# Patient Record
Sex: Male | Born: 1955 | Race: White | Hispanic: No | Marital: Single | Smoking: Never smoker
Health system: Southern US, Community
[De-identification: ages and names within clinical notes are randomized; demographics above are authoritative.]

## PROBLEM LIST (undated history)

## (undated) DIAGNOSIS — F4310 Post-traumatic stress disorder, unspecified: Secondary | ICD-10-CM

## (undated) DIAGNOSIS — F319 Bipolar disorder, unspecified: Secondary | ICD-10-CM

## (undated) DIAGNOSIS — F329 Major depressive disorder, single episode, unspecified: Secondary | ICD-10-CM

## (undated) DIAGNOSIS — F22 Delusional disorders: Secondary | ICD-10-CM

## (undated) DIAGNOSIS — F209 Schizophrenia, unspecified: Secondary | ICD-10-CM

## (undated) DIAGNOSIS — F32A Depression, unspecified: Secondary | ICD-10-CM

## (undated) DIAGNOSIS — F419 Anxiety disorder, unspecified: Secondary | ICD-10-CM

## (undated) HISTORY — PX: CHOLECYSTECTOMY: SHX55

## (undated) HISTORY — PX: SINUSOTOMY: SHX291

## (undated) HISTORY — PX: KNEE ARTHROPLASTY: SHX992

---

## 2000-06-12 ENCOUNTER — Inpatient Hospital Stay (HOSPITAL_COMMUNITY): Admission: AD | Admit: 2000-06-12 | Discharge: 2000-06-14 | Payer: Self-pay | Admitting: Addiction Psychiatry

## 2000-11-15 ENCOUNTER — Other Ambulatory Visit: Admission: RE | Admit: 2000-11-15 | Discharge: 2000-11-15 | Payer: Self-pay | Admitting: Otolaryngology

## 2003-08-03 ENCOUNTER — Ambulatory Visit (HOSPITAL_BASED_OUTPATIENT_CLINIC_OR_DEPARTMENT_OTHER): Admission: RE | Admit: 2003-08-03 | Discharge: 2003-08-04 | Payer: Self-pay | Admitting: Orthopaedic Surgery

## 2014-07-08 ENCOUNTER — Ambulatory Visit
Admission: RE | Admit: 2014-07-08 | Discharge: 2014-07-08 | Disposition: A | Discharge status: Home / Self Care | Payer: Medicare Other | Source: Ambulatory Visit | Attending: Family | Admitting: Family

## 2014-07-08 ENCOUNTER — Other Ambulatory Visit: Payer: Self-pay | Admitting: Family

## 2014-07-08 DIAGNOSIS — M79671 Pain in right foot: Secondary | ICD-10-CM

## 2014-12-21 ENCOUNTER — Other Ambulatory Visit: Payer: Self-pay | Admitting: Family

## 2014-12-21 ENCOUNTER — Ambulatory Visit
Admission: RE | Admit: 2014-12-21 | Discharge: 2014-12-21 | Disposition: A | Discharge status: Home / Self Care | Payer: Self-pay | Source: Ambulatory Visit | Attending: Family | Admitting: Family

## 2014-12-21 DIAGNOSIS — S8011XA Contusion of right lower leg, initial encounter: Secondary | ICD-10-CM

## 2015-06-25 ENCOUNTER — Encounter (HOSPITAL_COMMUNITY): Payer: Self-pay

## 2015-06-25 ENCOUNTER — Emergency Department (HOSPITAL_COMMUNITY): Payer: Medicare Other

## 2015-06-25 ENCOUNTER — Emergency Department (HOSPITAL_COMMUNITY)
Admission: EM | Admit: 2015-06-25 | Discharge: 2015-06-26 | Disposition: A | Discharge status: Home / Self Care | Payer: Medicare Other | Attending: Emergency Medicine | Admitting: Emergency Medicine

## 2015-06-25 DIAGNOSIS — F22 Delusional disorders: Secondary | ICD-10-CM | POA: Insufficient documentation

## 2015-06-25 DIAGNOSIS — F1210 Cannabis abuse, uncomplicated: Secondary | ICD-10-CM | POA: Insufficient documentation

## 2015-06-25 DIAGNOSIS — Z880 Allergy status to penicillin: Secondary | ICD-10-CM | POA: Diagnosis not present

## 2015-06-25 DIAGNOSIS — Z7982 Long term (current) use of aspirin: Secondary | ICD-10-CM | POA: Insufficient documentation

## 2015-06-25 DIAGNOSIS — F319 Bipolar disorder, unspecified: Secondary | ICD-10-CM | POA: Diagnosis not present

## 2015-06-25 DIAGNOSIS — Z9119 Patient's noncompliance with other medical treatment and regimen: Secondary | ICD-10-CM | POA: Diagnosis not present

## 2015-06-25 DIAGNOSIS — Z79899 Other long term (current) drug therapy: Secondary | ICD-10-CM | POA: Insufficient documentation

## 2015-06-25 DIAGNOSIS — F419 Anxiety disorder, unspecified: Secondary | ICD-10-CM | POA: Diagnosis not present

## 2015-06-25 DIAGNOSIS — F1310 Sedative, hypnotic or anxiolytic abuse, uncomplicated: Secondary | ICD-10-CM | POA: Insufficient documentation

## 2015-06-25 HISTORY — DX: Depression, unspecified: F32.A

## 2015-06-25 HISTORY — DX: Anxiety disorder, unspecified: F41.9

## 2015-06-25 HISTORY — DX: Major depressive disorder, single episode, unspecified: F32.9

## 2015-06-25 HISTORY — DX: Schizophrenia, unspecified: F20.9

## 2015-06-25 HISTORY — DX: Bipolar disorder, unspecified: F31.9

## 2015-06-25 HISTORY — DX: Delusional disorders: F22

## 2015-06-25 HISTORY — DX: Post-traumatic stress disorder, unspecified: F43.10

## 2015-06-25 LAB — BASIC METABOLIC PANEL
ANION GAP: 8 (ref 5–15)
BUN: 13 mg/dL (ref 6–20)
CALCIUM: 9.2 mg/dL (ref 8.9–10.3)
CHLORIDE: 106 mmol/L (ref 101–111)
CO2: 25 mmol/L (ref 22–32)
Creatinine, Ser: 1 mg/dL (ref 0.61–1.24)
GLUCOSE: 93 mg/dL (ref 65–99)
POTASSIUM: 4.2 mmol/L (ref 3.5–5.1)
SODIUM: 139 mmol/L (ref 135–145)

## 2015-06-25 LAB — ETHANOL

## 2015-06-25 LAB — ACETAMINOPHEN LEVEL: Acetaminophen (Tylenol), Serum: <10 ug/mL — ABNORMAL LOW (ref 10–30)

## 2015-06-25 LAB — CBC
HCT: 48.2 % (ref 39.0–52.0)
HEMOGLOBIN: 17.2 g/dL — AB (ref 13.0–17.0)
MCH: 32.1 pg (ref 26.0–34.0)
MCHC: 35.7 g/dL (ref 30.0–36.0)
MCV: 89.9 fL (ref 78.0–100.0)
Platelets: 187 10*3/uL (ref 150–400)
RBC: 5.36 MIL/uL (ref 4.22–5.81)
RDW: 12.8 % (ref 11.5–15.5)
WBC: 11.1 10*3/uL — ABNORMAL HIGH (ref 4.0–10.5)

## 2015-06-25 LAB — I-STAT TROPONIN, ED: TROPONIN I, POC: 0.02 ng/mL (ref 0.00–0.08)

## 2015-06-25 LAB — SALICYLATE LEVEL: Salicylate Lvl: <4 mg/dL (ref 2.8–30.0)

## 2015-06-25 NOTE — ED Notes (Signed)
Patient arrives via EMS from home with complaints of dry mouth.  He thinks he has been poisoned.  Delusional.  Neighbor at scene thought he should be evaluated further.  Hypertensive for EMS.

## 2015-06-25 NOTE — ED Notes (Signed)
Pt. Stated that he is taking Tramadol  tab PO nightly for his rib pain, pain at 8/10. MD notified, OK for pt. To take home med.

## 2015-06-25 NOTE — ED Notes (Signed)
Bed: [facility] Expected date:  Expected time:  Means of arrival:  Comments: 80M face burning/dry mouth

## 2015-06-25 NOTE — Discharge Instructions (Signed)
Paranoia °Paranoia is a distrust of others that is not based on a real reason for distrust. This may reach delusional levels. This means the paranoid person feels the world is against them when there is no reason to make them feel that way. People with paranoia feel as though people around them are "out to get them".  °SIMILAR MENTAL ILNESSES °· Depression is a feeling as though you are down all the time. It is normal in some situations where you have just lost a loved one. It is abnormal if you are having feelings of paranoia with it. °· Dementia is a physical problem with the brain in which the brain no longer works properly. There are problems with daily activities of living. Alzheimer's disease is one example of this. Dementia is also caused by old age changes in the brain which come with the death of brain cells and small strokes. °· Paranoidschizophrenia. People with paranoid schizophrenia and persecutory delusional disorder have delusions in which they feel people around them are plotting against them. Persecutory delusions in paranoid schizophrenia are bizarre, sometimes grandiose, and often accompanied by auditory hallucinations. This means the person is hearing voices that are not there. °· Delusionaldisorder (persecutory type). Delusions experienced by individuals with delusional disorder are more believable than those experienced by paranoid schizophrenics; they are not bizarre, though still unjustified. Individuals with delusional disorder may seem offbeat or quirky rather than mentally ill, and therefore, may never seek treatment. °All of these problems usually do not allow these people to interact socially in an acceptable manner. °CAUSES °The cause of paranoia is often not known. It is common in people with extended abuse of: °· Cocaine. °· Amphetamine. °· Marijuana. °· Alcohol. °Sometimes there is an inherited tendency. It may be associated with stress or changes in brain chemistry. °DIAGNOSIS    °When paranoia is present, your caregiver may: °· Refer you to a specialist. °· Do a physical exam. °· Perform other tests on you to make sure there are not other problems causing the paranoia including: °¨ Physical problems. °¨ Mental problems. °¨ Chemical problems (other than drugs). °Testing may be done to determine if there is a psychiatric disability present that can be treated with medicine. °TREATMENT  °· Paranoia that is a symptom of a psychiatric problem should be treated by professionals. °· Medicines are available which can help this disorder. Antipsychotic medicine may be prescribed by your caregiver. °· Sometimes psychotherapy may be useful. °· Conditions such as depression or drug abuse are treated individually. If the paranoia is caused by drug abuse, a treatment facility may be helpful. Depression may be helped by antidepressants. °PROGNOSIS  °· Paranoid people are difficult to treat because of their belief that everyone is out to get them or harm them. Because of this mistrust, they often must be talked into entering treatment by a trusted family member or friend. They may not want to take medicine as they may see this as an attempt to poison them. °· Gradual gains in the trust of a therapist or caregiver helps in a successful treatment plan. °· Some people with PPD or persecutory delusional disorder function in society without treatment in limited fashion. °Document Released: 12/06/2003 Document Revised: 02/25/2012 Document Reviewed: 08/10/2008 °[employer]® Patient Information ©2015 [employer]. This information is not intended to replace advice given to you by your health care provider. Make sure you discuss any questions you have with your health care provider. ° °

## 2015-06-25 NOTE — ED Notes (Signed)
MD at bedside. 

## 2015-06-26 LAB — RAPID URINE DRUG SCREEN, HOSP PERFORMED
Amphetamines: NOT DETECTED
Barbiturates: NOT DETECTED
Benzodiazepines: POSITIVE — AB
Cocaine: NOT DETECTED
OPIATES: NOT DETECTED
TETRAHYDROCANNABINOL: POSITIVE — AB

## 2015-06-26 NOTE — ED Notes (Signed)
Pt. Refused to be discharged , stated that he still feel sick and scared that "some people will spray on his face.' this pt. Stated that "something else is wrong on him and and he wanted to stay overnight for further observation." to notify MD and Charge Nurse.

## 2015-06-26 NOTE — ED Provider Notes (Signed)
CSN: [id number]     Arrival date & time 06/25/15  2101 History   First MD Initiated Contact with Patient 06/25/15 2133     Chief Complaint  Patient presents with  . Delusional     (Consider location/radiation/quality/duration/timing/severity/associated sxs/prior Treatment) Patient is a 59 y.o. male presenting with mental health disorder. The history is provided by the patient.  Mental Health Problem Presenting symptoms: paranoid behavior   Degree of incapacity (severity):  Mild Onset quality:  Gradual Timing:  Constant Progression:  Unchanged Chronicity:  Chronic Context: not noncompliant, not recent medication change and not stressful life event   Treatment compliance:  Untreated Relieved by:  Nothing Worsened by:  Nothing tried   Past Medical History  Diagnosis Date  . Anxiety   . Depression   . Bipolar 1 disorder   . Schizophrenia   . Delusional disorder   . PTSD (post-traumatic stress disorder)    Past Surgical History  Procedure Laterality Date  . Cholecystectomy    . Knee arthroplasty    . Sinusotomy     No family history on file. History  Substance Use Topics  . Smoking status: Never Smoker   . Smokeless tobacco: Not on file  . Alcohol Use: No    Review of Systems  Constitutional: Negative for fever.  Respiratory: Negative for cough and shortness of breath.   Psychiatric/Behavioral: Positive for paranoia.  All other systems reviewed and are negative.     Allergies  Penicillins and Sulfur  Home Medications   Prior to Admission medications   Medication Sig Start Date End Date Taking? Authorizing Provider  ALPRAZolam Prudy Feeler(XANAX) 1 MG tablet Take 1 mg by mouth 3 (three) times daily as needed. For panic attack/anxiety 06/15/15  Yes Historical Provider, MD  aspirin 325 MG tablet Take 325 mg by mouth every 4 (four) hours as needed (for pain.).   Yes Historical Provider, MD  lidocaine (LIDODERM) 5 % Place 1 patch onto the skin daily. Remove & Discard patch  within 12 hours or as directed by MD   Yes Historical Provider, MD  Multiple Vitamin (MULTIVITAMIN WITH MINERALS) TABS tablet Take 2 tablets by mouth daily. Men's Multivitamin   Yes Historical Provider, MD  traMADol (ULTRAM) 50 MG tablet Take 50 mg by mouth every 6 (six) hours as needed. For pain. 06/22/15  Yes Historical Provider, MD   BP 171/94 mmHg  Pulse 74  Temp(Src) 98.6 F (37 C) (Oral)  Resp 19  Ht 5\' 10"  (1.778 m)  Wt 217 lb (98.431 kg)  BMI 31.14 kg/m2  SpO2 95% Physical Exam  Constitutional: He is oriented to person, place, and time. He appears well-developed and well-nourished. No distress.  HENT:  Head: Normocephalic and atraumatic.  Mouth/Throat: No oropharyngeal exudate.  Eyes: EOM are normal. Pupils are equal, round, and reactive to light.  Neck: Normal range of motion. Neck supple.  Cardiovascular: Normal rate and regular rhythm.  Exam reveals no friction rub.   No murmur heard. Pulmonary/Chest: Effort normal and breath sounds normal. No respiratory distress. He has no wheezes. He has no rales.  Abdominal: He exhibits no distension. There is no tenderness. There is no rebound.  Musculoskeletal: Normal range of motion. He exhibits no edema.  Neurological: He is alert and oriented to person, place, and time.  Skin: He is not diaphoretic.  Psychiatric: Thought content is paranoid.  Nursing note and vitals reviewed.   ED Course  Procedures (including critical care time) Labs Review Labs Reviewed  CBC -  Abnormal; Notable for the following:    WBC 11.1 (*)    Hemoglobin 17.2 (*)    All other components within normal limits  URINE RAPID DRUG SCREEN, HOSP PERFORMED - Abnormal; Notable for the following:    Benzodiazepines POSITIVE (*)    Tetrahydrocannabinol POSITIVE (*)    All other components within normal limits  ACETAMINOPHEN LEVEL - Abnormal; Notable for the following:    Acetaminophen (Tylenol), Serum <10 (*)    All other components within normal limits   BASIC METABOLIC PANEL  SALICYLATE LEVEL  ETHANOL  I-STAT TROPOININ, ED    Imaging Review Dg Chest 2 View  06/25/2015   CLINICAL DATA:  Coughing  EXAM: CHEST  2 VIEW  COMPARISON:  None.  FINDINGS: Normal heart size and mediastinal contours. No acute infiltrate or edema. No effusion or pneumothorax. No acute osseous findings.  Cholecystectomy.  IMPRESSION: No active cardiopulmonary disease.   Electronically Signed   By: Marnee Spring M.D.   On: 06/25/2015 22:47     EKG Interpretation None      MDM   Final diagnoses:  Delusional disorder     59 year old male with history of delusional disorder, schizophrenia presents with delusions. His neighbors heard them talking about some things one-handed get checked out. Here he is doing well, denies any SI or HI. He states he is afraid someone is trying to hurt him. He states that been doing it for 20 years. He also reports he was told he had a history of delusions by his psychiatrist. He reports his blood checked for  poisons. Told him I can check for some minor things but not major  Heavy-metal poisons. Patient is amenable to this plan. He is well-appearing. He does not meet any inpatient criteria as his delusions are chronic and he does not appear to be a danger to himself.  Lab workup negative. Stable for discharge.    Elwin Mocha, MD 06/26/15 2107

## 2015-06-26 NOTE — ED Notes (Signed)
This Nurse and Gearldine BienenstockBrandy ,RN witnessed that pt. Received his bot. Of tramadol intact

## 2015-07-11 ENCOUNTER — Encounter (HOSPITAL_COMMUNITY): Payer: Self-pay | Admitting: Emergency Medicine

## 2015-07-11 ENCOUNTER — Emergency Department (HOSPITAL_COMMUNITY)
Admission: EM | Admit: 2015-07-11 | Discharge: 2015-07-12 | Disposition: A | Discharge status: Home / Self Care | Payer: Medicare Other | Attending: Emergency Medicine | Admitting: Emergency Medicine

## 2015-07-11 DIAGNOSIS — F1310 Sedative, hypnotic or anxiolytic abuse, uncomplicated: Secondary | ICD-10-CM | POA: Diagnosis not present

## 2015-07-11 DIAGNOSIS — F22 Delusional disorders: Secondary | ICD-10-CM | POA: Diagnosis not present

## 2015-07-11 DIAGNOSIS — Z7982 Long term (current) use of aspirin: Secondary | ICD-10-CM | POA: Insufficient documentation

## 2015-07-11 DIAGNOSIS — R202 Paresthesia of skin: Secondary | ICD-10-CM | POA: Diagnosis not present

## 2015-07-11 DIAGNOSIS — F319 Bipolar disorder, unspecified: Secondary | ICD-10-CM | POA: Insufficient documentation

## 2015-07-11 DIAGNOSIS — R443 Hallucinations, unspecified: Secondary | ICD-10-CM | POA: Diagnosis present

## 2015-07-11 DIAGNOSIS — F419 Anxiety disorder, unspecified: Secondary | ICD-10-CM | POA: Diagnosis not present

## 2015-07-11 DIAGNOSIS — R61 Generalized hyperhidrosis: Secondary | ICD-10-CM | POA: Insufficient documentation

## 2015-07-11 DIAGNOSIS — F1210 Cannabis abuse, uncomplicated: Secondary | ICD-10-CM | POA: Insufficient documentation

## 2015-07-11 DIAGNOSIS — Z79899 Other long term (current) drug therapy: Secondary | ICD-10-CM | POA: Insufficient documentation

## 2015-07-11 DIAGNOSIS — F259 Schizoaffective disorder, unspecified: Secondary | ICD-10-CM | POA: Diagnosis not present

## 2015-07-11 LAB — COMPREHENSIVE METABOLIC PANEL
ALBUMIN: 4.3 g/dL (ref 3.5–5.0)
ALT: 74 U/L — ABNORMAL HIGH (ref 17–63)
ANION GAP: 7 (ref 5–15)
AST: 48 U/L — AB (ref 15–41)
Alkaline Phosphatase: 73 U/L (ref 38–126)
BILIRUBIN TOTAL: 0.7 mg/dL (ref 0.3–1.2)
BUN: 11 mg/dL (ref 6–20)
CALCIUM: 9 mg/dL (ref 8.9–10.3)
CHLORIDE: 105 mmol/L (ref 101–111)
CO2: 26 mmol/L (ref 22–32)
Creatinine, Ser: 0.72 mg/dL (ref 0.61–1.24)
GFR calc Af Amer: >60 mL/min (ref >60)
GFR calc non Af Amer: >60 mL/min (ref >60)
Glucose, Bld: 109 mg/dL — ABNORMAL HIGH (ref 65–99)
POTASSIUM: 3.9 mmol/L (ref 3.5–5.1)
SODIUM: 138 mmol/L (ref 135–145)
Total Protein: 7.1 g/dL (ref 6.5–8.1)

## 2015-07-11 LAB — CBC WITH DIFFERENTIAL/PLATELET
BASOS ABS: 0 10*3/uL (ref 0.0–0.1)
Basophils Relative: 0 % (ref 0–1)
EOS ABS: 0.3 10*3/uL (ref 0.0–0.7)
EOS PCT: 2 % (ref 0–5)
HEMATOCRIT: 46.2 % (ref 39.0–52.0)
HEMOGLOBIN: 16.2 g/dL (ref 13.0–17.0)
Lymphocytes Relative: 32 % (ref 12–46)
Lymphs Abs: 3.4 10*3/uL (ref 0.7–4.0)
MCH: 31.8 pg (ref 26.0–34.0)
MCHC: 35.1 g/dL (ref 30.0–36.0)
MCV: 90.6 fL (ref 78.0–100.0)
Monocytes Absolute: 1.2 10*3/uL — ABNORMAL HIGH (ref 0.1–1.0)
Monocytes Relative: 12 % (ref 3–12)
Neutro Abs: 5.5 10*3/uL (ref 1.7–7.7)
Neutrophils Relative %: 54 % (ref 43–77)
Platelets: 198 10*3/uL (ref 150–400)
RBC: 5.1 MIL/uL (ref 4.22–5.81)
RDW: 13 % (ref 11.5–15.5)
WBC: 10.4 10*3/uL (ref 4.0–10.5)

## 2015-07-11 LAB — RAPID URINE DRUG SCREEN, HOSP PERFORMED
Amphetamines: NOT DETECTED
BENZODIAZEPINES: POSITIVE — AB
Barbiturates: NOT DETECTED
COCAINE: NOT DETECTED
Opiates: NOT DETECTED
TETRAHYDROCANNABINOL: POSITIVE — AB

## 2015-07-11 LAB — ACETAMINOPHEN LEVEL: Acetaminophen (Tylenol), Serum: <10 ug/mL — ABNORMAL LOW (ref 10–30)

## 2015-07-11 LAB — SALICYLATE LEVEL: Salicylate Lvl: <4 mg/dL (ref 2.8–30.0)

## 2015-07-11 LAB — I-STAT TROPONIN, ED: TROPONIN I, POC: 0.01 ng/mL (ref 0.00–0.08)

## 2015-07-11 LAB — ETHANOL

## 2015-07-11 MED ORDER — ALPRAZOLAM 1 MG PO TABS
1.0000 mg | ORAL_TABLET | Freq: Three times a day (TID) | ORAL | Status: DC | PRN
  Administered 2015-07-11 – 2015-07-12 (×3): 1 mg via ORAL
  Filled 2015-07-11 (×3): qty 1

## 2015-07-11 MED ORDER — ONDANSETRON HCL 4 MG PO TABS: 4.0000 mg | ORAL_TABLET | Freq: Three times a day (TID) | ORAL | Status: DC | PRN

## 2015-07-11 MED ORDER — LORAZEPAM 0.5 MG PO TABS
1.0000 mg | ORAL_TABLET | Freq: Three times a day (TID) | ORAL | Status: DC | PRN
  Administered 2015-07-11 – 2015-07-12 (×2): 1 mg via ORAL
  Filled 2015-07-11 (×2): qty 2

## 2015-07-11 MED ORDER — IBUPROFEN 200 MG PO TABS: 600.0000 mg | ORAL_TABLET | Freq: Three times a day (TID) | ORAL | Status: DC | PRN

## 2015-07-11 MED ORDER — TRAMADOL HCL 50 MG PO TABS
50.0000 mg | ORAL_TABLET | Freq: Four times a day (QID) | ORAL | Status: DC | PRN
  Administered 2015-07-11 – 2015-07-12 (×3): 50 mg via ORAL
  Filled 2015-07-11 (×3): qty 1

## 2015-07-11 MED ORDER — NICOTINE 21 MG/24HR TD PT24: 21.0000 mg | MEDICATED_PATCH | Freq: Every day | TRANSDERMAL | Status: DC

## 2015-07-11 MED ORDER — ACETAMINOPHEN 325 MG PO TABS: 650.0000 mg | ORAL_TABLET | ORAL | Status: DC | PRN

## 2015-07-11 NOTE — ED Notes (Signed)
Bed: WA33 Expected date:  Expected time:  Means of arrival:  Comments: Hold for room 10 

## 2015-07-11 NOTE — ED Notes (Signed)
Pt being dressed out in scrubs. Security called to wand patient.

## 2015-07-11 NOTE — ED Notes (Signed)
TTS at bedside. 

## 2015-07-11 NOTE — ED Provider Notes (Signed)
CSN: [id number]     Arrival date & time 07/11/15  0803 History   First MD Initiated Contact with Patient 07/11/15 0805     Chief Complaint  Patient presents with  . Ingestion    questionable  . Hallucinations     (Consider location/radiation/quality/duration/timing/severity/associated sxs/prior Treatment) HPI Comments: 59 year old male with a history of anxiety, depression, bipolar schizophrenia, delusional disorder, and PTSD presents to the emergency department for complaints of dry mouth mild diaphoresis. Patient states that his symptoms began this morning after drinking coffee and juice. He reports that he noted a better taste in his mouth. He is concerned that someone may have put "PCP or Angel dust in my coffee". He lives alone. Patient states he has had similar symptoms in the past. He initially thought his symptoms were due to dust in his air ducts, but he had them cleaned a few years ago for this reason. He also reports he has been "hearing sounds of gravel dropping on the roof". He sees a psychiatrist on an outpatient basis who prescribes him Xanax. He has been compliant with this medicine, per patient. He has no c/o syncope, SOB, vomiting, or extremity weakness. No SI/HI.  Patient is a 59 y.o. male presenting with Ingested Medication. The history is provided by the patient. No language interpreter was used.  Ingestion Associated symptoms include diaphoresis. Pertinent negatives include no fever or vomiting.    Past Medical History  Diagnosis Date  . Anxiety   . Depression   . Bipolar 1 disorder   . Schizophrenia   . Delusional disorder   . PTSD (post-traumatic stress disorder)    Past Surgical History  Procedure Laterality Date  . Cholecystectomy    . Knee arthroplasty    . Sinusotomy     No family history on file. History  Substance Use Topics  . Smoking status: Never Smoker   . Smokeless tobacco: Not on file  . Alcohol Use: No    Review of Systems   Constitutional: Positive for diaphoresis. Negative for fever.  Respiratory: Negative for shortness of breath.   Gastrointestinal: Negative for vomiting.  Neurological: Negative for syncope.       +tingling in b/l feet  Psychiatric/Behavioral: Negative for suicidal ideas. The patient is nervous/anxious.   All other systems reviewed and are negative.   Allergies  Penicillins and Sulfur  Home Medications   Prior to Admission medications   Medication Sig Start Date End Date Taking? Authorizing Provider  ALPRAZolam Prudy Feeler) 1 MG tablet Take 1 mg by mouth 3 (three) times daily as needed. For panic attack/anxiety 06/15/15  Yes Historical Provider, MD  aspirin 325 MG tablet Take 325 mg by mouth every 4 (four) hours as needed (for pain.).   Yes Historical Provider, MD  lidocaine (LIDODERM) 5 % Place 1 patch onto the skin daily. Remove & Discard patch within 12 hours or as directed by MD   Yes Historical Provider, MD  Multiple Vitamin (MULTIVITAMIN WITH MINERALS) TABS tablet Take 1-2 tablets by mouth daily. Men's Multivitamin   Yes Historical Provider, MD  traMADol (ULTRAM) 50 MG tablet Take 50 mg by mouth every 6 (six) hours as needed. For pain. 06/22/15  Yes Historical Provider, MD   BP 160/100 mmHg  Pulse 88  Temp(Src) 98.4 F (36.9 C) (Oral)  Resp 15  SpO2 98%   Physical Exam  Constitutional: He is oriented to person, place, and time. He appears well-developed and well-nourished. No distress.  Patient persistently spitting in to a  tissue  HENT:  Head: Normocephalic and atraumatic.  Mouth/Throat: Oropharynx is clear and moist. No oropharyngeal exudate.  Patient tolerating secretions without difficulty. No angioedema  Eyes: Conjunctivae and EOM are normal. No scleral icterus.  Neck: Normal range of motion.  Cardiovascular: Normal rate, regular rhythm and intact distal pulses.   Pulmonary/Chest: Effort normal and breath sounds normal. No respiratory distress. He has no wheezes. He has no  rales.  Lungs clear bilaterally. Chest expansion symmetric. No hypoxia on room air. No tachypnea or dyspnea.  Musculoskeletal: Normal range of motion.  Neurological: He is alert and oriented to person, place, and time. He exhibits normal muscle tone. Coordination normal.  Skin: Skin is warm and dry. No rash noted. He is not diaphoretic. No erythema. No pallor.  Psychiatric: His mood appears anxious. His speech is rapid and/or pressured (mild). He is agitated (mild). Thought content is paranoid. He expresses no homicidal and no suicidal ideation.  Nursing note and vitals reviewed.   ED Course  Procedures (including critical care time) Labs Review Labs Reviewed  CBC WITH DIFFERENTIAL/PLATELET - Abnormal; Notable for the following:    Monocytes Absolute 1.2 (*)    All other components within normal limits  COMPREHENSIVE METABOLIC PANEL - Abnormal; Notable for the following:    Glucose, Bld 109 (*)    AST 48 (*)    ALT 74 (*)    All other components within normal limits  ACETAMINOPHEN LEVEL - Abnormal; Notable for the following:    Acetaminophen (Tylenol), Serum <10 (*)    All other components within normal limits  URINE RAPID DRUG SCREEN, HOSP PERFORMED - Abnormal; Notable for the following:    Benzodiazepines POSITIVE (*)    Tetrahydrocannabinol POSITIVE (*)    All other components within normal limits  SALICYLATE LEVEL  ETHANOL  I-STAT TROPOININ, ED    Imaging Review No results found.   EKG Interpretation None      MDM   Final diagnoses:  Schizoaffective disorder, unspecified type  Paranoia    59 year old male presents to the emergency Department with complaints that his juice or coffee have been poisoned with PCP or Angel dust this morning. Patient has been evaluated by psychiatry who recommended inpatient treatment. Patient denies any suicidal or homicidal thoughts. He is pending inpatient placement. Patient has been medically cleared. Disposition to be determined by  oncoming ED provider.   Filed Vitals:   07/11/15 0804 07/11/15 0814 07/11/15 1352  BP:  153/83 160/100  Pulse:  88   Temp:  98.4 F (36.9 C)   TempSrc:  Oral   Resp:  15   SpO2: 98% 98%      Antony Madura, PA-C 07/11/15 1609  Azalia Bilis, MD 07/11/15 [phone]

## 2015-07-11 NOTE — ED Notes (Signed)
PA at bedside.

## 2015-07-11 NOTE — ED Notes (Signed)
Pt standing in the hallways stating that he can't go back to that box of a room. The stuff coming out of the vents is burning my face as you can see. This RN gave patient wash cloth to wipe face and assisted patient back to room. Pt sitting in the chair.

## 2015-07-11 NOTE — ED Notes (Signed)
Bed: KG40 Expected date:  Expected time:  Means of arrival:  Comments: EMS-possible poisoning

## 2015-07-11 NOTE — ED Notes (Signed)
59 yo male from home via EMS with reports of "Some one putting PCP or Angel dust in my coffee." Pt stated there was a bitter taste in his mouth. Pt lives alone and is unsure of who would put something in his coffee. Pt is alert and oriented. Denies SOB or chest pain. Pt has hx of psych.

## 2015-07-11 NOTE — ED Notes (Signed)
Report given to TCU. Pt wanded and cleared to go back to room #33

## 2015-07-11 NOTE — ED Notes (Signed)
Patient ambulated over from TCU.  Is sitting in the hallway with a washcloth over his mouth and nose.  States he feels he is allergic to something in here.  He is asking about his pain medication and his anxiety medication.  I assured him that they were locked in the pharmacy and safe.

## 2015-07-11 NOTE — ED Notes (Signed)
Patient has been sitting in the hallway most of the day.  He keeps telling me that he is being poisoned through the food and through the bathroom vents.  States the poisoning is making him swell up and making his bottom bleed.  He wanted to call his lawyer and I looked up the number for him.  He was trying to get the lawyer to come up here and get him out of the ED.
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
## 2015-07-12 DIAGNOSIS — F259 Schizoaffective disorder, unspecified: Secondary | ICD-10-CM | POA: Insufficient documentation

## 2015-07-12 DIAGNOSIS — F22 Delusional disorders: Secondary | ICD-10-CM

## 2015-07-12 MED ORDER — NICOTINE 21 MG/24HR TD PT24: 21.0000 mg | MEDICATED_PATCH | Freq: Every day | TRANSDERMAL | Status: AC
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
## 2015-07-12 NOTE — BH Assessment (Signed)
Sent referrals to [facility] [place], [place], and [address]    Clista Bernhardt, [facility] Triage Specialist 07/12/2015 1:26 AM

## 2015-07-12 NOTE — Discharge Instructions (Signed)
For your ongoing behavioral health needs, you are advised to continue treatment with your current psychiatrist, Milagros Evener, MD.  Bonita Quin have been scheduled for a new appointment on Thursday, 07/14/2015 at 4:45 pm:       Milagros Evener, MD      [address], [address]      [place], [place] [address]      [phone]

## 2015-07-12 NOTE — BH Assessment (Signed)
[facility] Assessment Progress Note  Per Thedore Mins, MD, this pt does not require psychiatric hospitalization at this time.  He is to be released from [facility] with recommendation to follow up with Milagros Evener, MD, his outpatient psychiatrist.  Pt has been scheduled for a new appointment with Dr Evelene Croon on Thursday, 07/14/2015 at 16:45.  This has been included in his discharge instructions.  Pt's nurse has been notified.  Doylene Canning, MA Triage Specialist [phone]

## 2015-07-12 NOTE — ED Notes (Signed)
Patient denies SI and HI but admits to AVH. Patient appears anxious at this time. Encouragement and support provided and safety maintain. Q 15 min safety checks remain in place.

## 2015-07-12 NOTE — [facility] Suicide Risk Assessment (Cosign Needed)
Suicide Risk Assessment  Discharge Assessment   [facility] Discharge Suicide Risk Assessment   Demographic Factors:  Male, Caucasian, Low socioeconomic status, Living alone and Unemployed  Total Time spent with patient: 20 minutes  Musculoskeletal: Strength & Muscle Tone: within normal limits Gait & Station: normal Patient leans: N/A  Psychiatric Specialty Exam:     Blood pressure 125/80, pulse 85, temperature 98 F (36.7 C), temperature source Oral, resp. rate 18, SpO2 97 %.There is no weight on file to calculate BMI.  General Appearance: Casual  Eye Contact::  Good  Speech:  Clear and Coherent and Normal Rate  Volume:  Normal  Mood:  Anxious  Affect:  Congruent  Thought Process:  Disorganized  Orientation:  Full (Time, Place, and Person)  Thought Content:  Delusions and Paranoid Ideation  Suicidal Thoughts:  No  Homicidal Thoughts:  No  Memory:  Immediate;   Fair Recent;   Fair Remote;   Fair  Judgement:  Impaired  Insight:  Fair  Psychomotor Activity:  Normal  Concentration:  Good  Recall:  NA  Fund of Knowledge:Fair  Language: Good  Akathisia:  NA  Handed:  Right  AIMS (if indicated):     Assets:  Desire for Improvement  ADL's:  Intact  Cognition: WNL  Sleep:           Has this patient used any form of tobacco in the last 30 days? (Cigarettes, Smokeless Tobacco, Cigars, and/or Pipes) Yes, Prescription not provided because: Prescription accepted by patient  Mental Status Per Nursing Assessment::   On Admission:     Current Mental Status by Physician: NA  Loss Factors: NA  Historical Factors: NA  Risk Reduction Factors:   Religious beliefs about death  Continued Clinical Symptoms:  Previous Psychiatric Diagnoses and Treatments  Cognitive Features That Contribute To Risk:  Polarized thinking    Suicide Risk:  Minimal: No identifiable suicidal ideation.  Patients presenting with no risk factors but with morbid ruminations; may be classified as  minimal risk based on the severity of the depressive symptoms  Principal Problem: Paranoia (psychosis) Discharge Diagnoses:  Patient Active Problem List   Diagnosis Date Noted  . 

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
## 2016-09-16 ENCOUNTER — Emergency Department (HOSPITAL_COMMUNITY): Payer: No Typology Code available for payment source

## 2016-09-16 ENCOUNTER — Encounter (HOSPITAL_COMMUNITY): Payer: Self-pay | Admitting: Emergency Medicine

## 2016-09-16 ENCOUNTER — Emergency Department (HOSPITAL_COMMUNITY)
Admission: EM | Admit: 2016-09-16 | Discharge: 2016-09-16 | Disposition: A | Discharge status: Home / Self Care | Payer: No Typology Code available for payment source | Attending: Emergency Medicine | Admitting: Emergency Medicine

## 2016-09-16 DIAGNOSIS — Y999 Unspecified external cause status: Secondary | ICD-10-CM | POA: Insufficient documentation

## 2016-09-16 DIAGNOSIS — Z7982 Long term (current) use of aspirin: Secondary | ICD-10-CM | POA: Diagnosis not present

## 2016-09-16 DIAGNOSIS — Z96659 Presence of unspecified artificial knee joint: Secondary | ICD-10-CM | POA: Diagnosis not present

## 2016-09-16 DIAGNOSIS — S0990XA Unspecified injury of head, initial encounter: Secondary | ICD-10-CM | POA: Diagnosis present

## 2016-09-16 DIAGNOSIS — Z79899 Other long term (current) drug therapy: Secondary | ICD-10-CM | POA: Diagnosis not present

## 2016-09-16 DIAGNOSIS — S8991XA Unspecified injury of right lower leg, initial encounter: Secondary | ICD-10-CM | POA: Diagnosis not present

## 2016-09-16 DIAGNOSIS — Y939 Activity, unspecified: Secondary | ICD-10-CM | POA: Insufficient documentation

## 2016-09-16 DIAGNOSIS — Y92410 Unspecified street and highway as the place of occurrence of the external cause: Secondary | ICD-10-CM | POA: Insufficient documentation

## 2016-09-16 DIAGNOSIS — R791 Abnormal coagulation profile: Secondary | ICD-10-CM | POA: Diagnosis not present

## 2016-09-16 LAB — CBC
HCT: 46.9 % (ref 39.0–52.0)
HEMOGLOBIN: 15.8 g/dL (ref 13.0–17.0)
MCH: 31 pg (ref 26.0–34.0)
MCHC: 33.7 g/dL (ref 30.0–36.0)
MCV: 92 fL (ref 78.0–100.0)
Platelets: 147 10*3/uL — ABNORMAL LOW (ref 150–400)
RBC: 5.1 MIL/uL (ref 4.22–5.81)
RDW: 13.5 % (ref 11.5–15.5)
WBC: 8.2 10*3/uL (ref 4.0–10.5)

## 2016-09-16 LAB — I-STAT CHEM 8, ED
BUN: 7 mg/dL (ref 6–20)
CHLORIDE: 104 mmol/L (ref 101–111)
Calcium, Ion: 1.1 mmol/L — ABNORMAL LOW (ref 1.15–1.40)
Creatinine, Ser: 0.7 mg/dL (ref 0.61–1.24)
Glucose, Bld: 82 mg/dL (ref 65–99)
HEMATOCRIT: 46 % (ref 39.0–52.0)
Hemoglobin: 15.6 g/dL (ref 13.0–17.0)
POTASSIUM: 4.1 mmol/L (ref 3.5–5.1)
SODIUM: 139 mmol/L (ref 135–145)
TCO2: 26 mmol/L (ref 0–100)

## 2016-09-16 LAB — URINE MICROSCOPIC-ADD ON: Bacteria, UA: NONE SEEN

## 2016-09-16 LAB — PROTIME-INR
INR: 1.06
Prothrombin Time: 13.9 seconds (ref 11.4–15.2)

## 2016-09-16 LAB — SAMPLE TO BLOOD BANK

## 2016-09-16 LAB — COMPREHENSIVE METABOLIC PANEL
ALK PHOS: 81 U/L (ref 38–126)
ALT: 27 U/L (ref 17–63)
ANION GAP: 4 — AB (ref 5–15)
AST: 28 U/L (ref 15–41)
Albumin: 3.7 g/dL (ref 3.5–5.0)
BUN: 7 mg/dL (ref 6–20)
CO2: 25 mmol/L (ref 22–32)
CREATININE: 0.81 mg/dL (ref 0.61–1.24)
Calcium: 9 mg/dL (ref 8.9–10.3)
Chloride: 109 mmol/L (ref 101–111)
GFR calc Af Amer: >60 mL/min (ref >60)
GFR calc non Af Amer: >60 mL/min (ref >60)
GLUCOSE: 85 mg/dL (ref 65–99)
Potassium: 4.1 mmol/L (ref 3.5–5.1)
SODIUM: 138 mmol/L (ref 135–145)
Total Bilirubin: 0.9 mg/dL (ref 0.3–1.2)
Total Protein: 6.4 g/dL — ABNORMAL LOW (ref 6.5–8.1)

## 2016-09-16 LAB — URINALYSIS, ROUTINE W REFLEX MICROSCOPIC
Bilirubin Urine: NEGATIVE
GLUCOSE, UA: NEGATIVE mg/dL
Ketones, ur: NEGATIVE mg/dL
Leukocytes, UA: NEGATIVE
Nitrite: NEGATIVE
PROTEIN: NEGATIVE mg/dL
Specific Gravity, Urine: 1.01 (ref 1.005–1.030)
pH: 6 (ref 5.0–8.0)

## 2016-09-16 LAB — CDS SEROLOGY

## 2016-09-16 LAB — ETHANOL

## 2016-09-16 LAB — I-STAT CG4 LACTIC ACID, ED: Lactic Acid, Venous: 1.1 mmol/L (ref 0.5–1.9)

## 2016-09-16 MED ORDER — MORPHINE SULFATE (PF) 4 MG/ML IV SOLN: 8.0000 mg | Freq: Once | INTRAVENOUS | Status: DC

## 2016-09-16 MED ORDER — OXYCODONE-ACETAMINOPHEN 5-325 MG PO TABS: 1.0000 | ORAL_TABLET | ORAL | 0 refills | Status: AC | PRN

## 2016-09-16 MED ORDER — OXYCODONE-ACETAMINOPHEN 5-325 MG PO TABS
1.0000 | ORAL_TABLET | Freq: Once | ORAL | Status: AC
  Administered 2016-09-16: 1 via ORAL
  Filled 2016-09-16: qty 1
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
## 2016-09-16 NOTE — ED Notes (Signed)
Pt is in xray
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
## 2016-09-16 NOTE — ED Notes (Signed)
The pt reports that he has no pain unless he moves his rt knee

## 2016-09-16 NOTE — ED Notes (Signed)
The ed res has discontinued the iv medd  Po orfder to give

## 2016-09-16 NOTE — ED Notes (Signed)
Pt ambulated to restroom from room, tolerated well. 

## 2016-09-16 NOTE — ED Notes (Signed)
Patient transported to CT 
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
## 2016-09-23 ENCOUNTER — Encounter (HOSPITAL_COMMUNITY): Payer: Self-pay

## 2016-09-23 ENCOUNTER — Emergency Department (HOSPITAL_COMMUNITY)
Admission: EM | Admit: 2016-09-23 | Discharge: 2016-09-23 | Disposition: A | Discharge status: Home / Self Care | Payer: Medicare Other | Attending: Emergency Medicine | Admitting: Emergency Medicine

## 2016-09-23 DIAGNOSIS — S3992XD Unspecified injury of lower back, subsequent encounter: Secondary | ICD-10-CM | POA: Diagnosis not present

## 2016-09-23 DIAGNOSIS — M545 Low back pain, unspecified: Secondary | ICD-10-CM

## 2016-09-23 MED ORDER — DICLOFENAC SODIUM 1 % TD GEL: 2.0000 g | Freq: Four times a day (QID) | TRANSDERMAL | 0 refills | Status: AC

## 2016-09-23 NOTE — Discharge Instructions (Signed)
Please read and follow all provided instructions.  Your diagnoses today include:  1. Acute bilateral low back pain without sciatica    Tests performed today include: Vital signs - see below for your results today  Medications prescribed:   Take any prescribed medications only as directed.  Home care instructions:  Follow any educational materials contained in this packet Please rest, use ice or heat on your back for the next several days Do not lift, push, pull anything more than 10 pounds for the next week  Follow-up instructions: Please follow-up with your primary care provider in the next 1 week for further evaluation of your symptoms.   Return instructions:  SEEK IMMEDIATE MEDICAL ATTENTION IF YOU HAVE: New numbness, tingling, weakness, or problem with the use of your arms or legs Severe back pain not relieved with medications Loss control of your bowels or bladder Increasing pain in any areas of the body (such as chest or abdominal pain) Shortness of breath, dizziness, or fainting.  Worsening nausea (feeling sick to your stomach), vomiting, fever, or sweats Any other emergent concerns regarding your health   Additional Information:  Your vital signs today were: BP 132/80 (BP Location: Right Arm)    Pulse 80    Temp 98.3 F (36.8 C) (Oral)    Resp 17    Ht 5\' 10"  (1.778 m)    Wt 99.8 kg    SpO2 97%    BMI 31.57 kg/m  If your blood pressure (BP) was elevated above 135/85 this visit, please have this repeated by your doctor within one month. --------------
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
## 2016-09-23 NOTE — ED Notes (Signed)
Declined W/C at D/C and was escorted to lobby by RN. 

## 2016-09-29 ENCOUNTER — Emergency Department (HOSPITAL_COMMUNITY)
Admission: EM | Admit: 2016-09-29 | Discharge: 2016-09-29 | Disposition: A | Discharge status: Home / Self Care | Payer: Medicare Other | Attending: Emergency Medicine | Admitting: Emergency Medicine

## 2016-09-29 ENCOUNTER — Emergency Department (HOSPITAL_COMMUNITY): Payer: Medicare Other

## 2016-09-29 ENCOUNTER — Encounter (HOSPITAL_COMMUNITY): Payer: Self-pay | Admitting: Emergency Medicine

## 2016-09-29 DIAGNOSIS — M542 Cervicalgia: Secondary | ICD-10-CM | POA: Diagnosis not present

## 2016-09-29 DIAGNOSIS — L02412 Cutaneous abscess of left axilla: Secondary | ICD-10-CM | POA: Diagnosis not present

## 2016-09-29 DIAGNOSIS — Z96659 Presence of unspecified artificial knee joint: Secondary | ICD-10-CM | POA: Insufficient documentation

## 2016-09-29 DIAGNOSIS — M546 Pain in thoracic spine: Secondary | ICD-10-CM | POA: Insufficient documentation

## 2016-09-29 DIAGNOSIS — S99921D Unspecified injury of right foot, subsequent encounter: Secondary | ICD-10-CM | POA: Diagnosis present

## 2016-09-29 DIAGNOSIS — L0291 Cutaneous abscess, unspecified: Secondary | ICD-10-CM

## 2016-09-29 DIAGNOSIS — M79671 Pain in right foot: Secondary | ICD-10-CM

## 2016-09-29 DIAGNOSIS — S96911D Strain of unspecified muscle and tendon at ankle and foot level, right foot, subsequent encounter: Secondary | ICD-10-CM | POA: Diagnosis not present

## 2016-09-29 DIAGNOSIS — T148XXA Other injury of unspecified body region, initial encounter: Secondary | ICD-10-CM

## 2016-09-29 MED ORDER — IBUPROFEN 600 MG PO TABS: 600.0000 mg | ORAL_TABLET | Freq: Four times a day (QID) | ORAL | 0 refills | Status: AC | PRN

## 2016-09-29 MED ORDER — CLINDAMYCIN HCL 150 MG PO CAPS: 300.0000 mg | ORAL_CAPSULE | Freq: Three times a day (TID) | ORAL | 0 refills | Status: AC

## 2016-09-29 NOTE — ED Notes (Signed)
Declined W/C at D/C and was escorted to lobby by RN. 
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
## 2016-12-22 IMAGING — CR DG CHEST 2V
2 series · 2 of 2 positions shown · non-contrast
Comparison: None.

CLINICAL DATA: Coughing

EXAM:
CHEST  2 VIEW

[w chest pa]
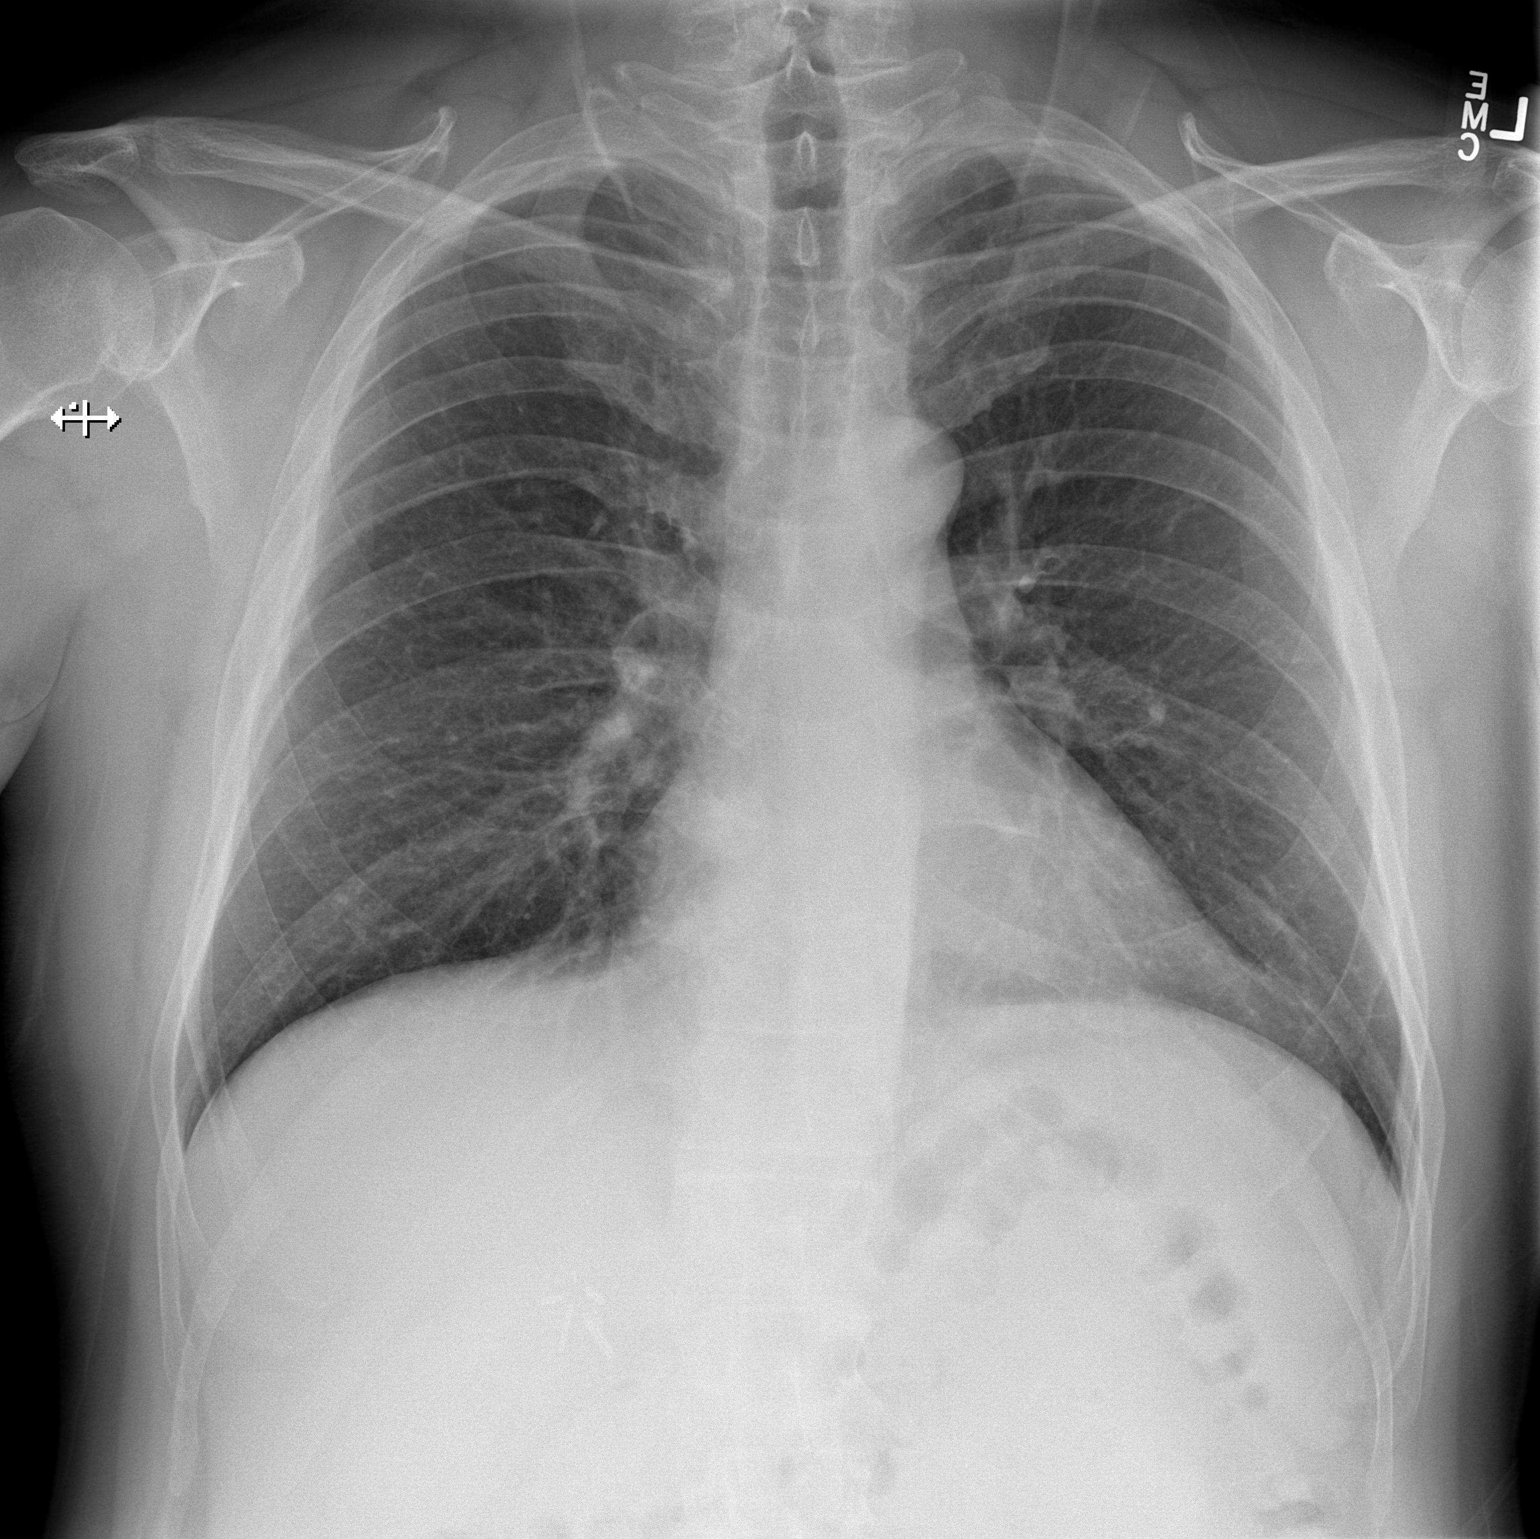

[w chest lat]
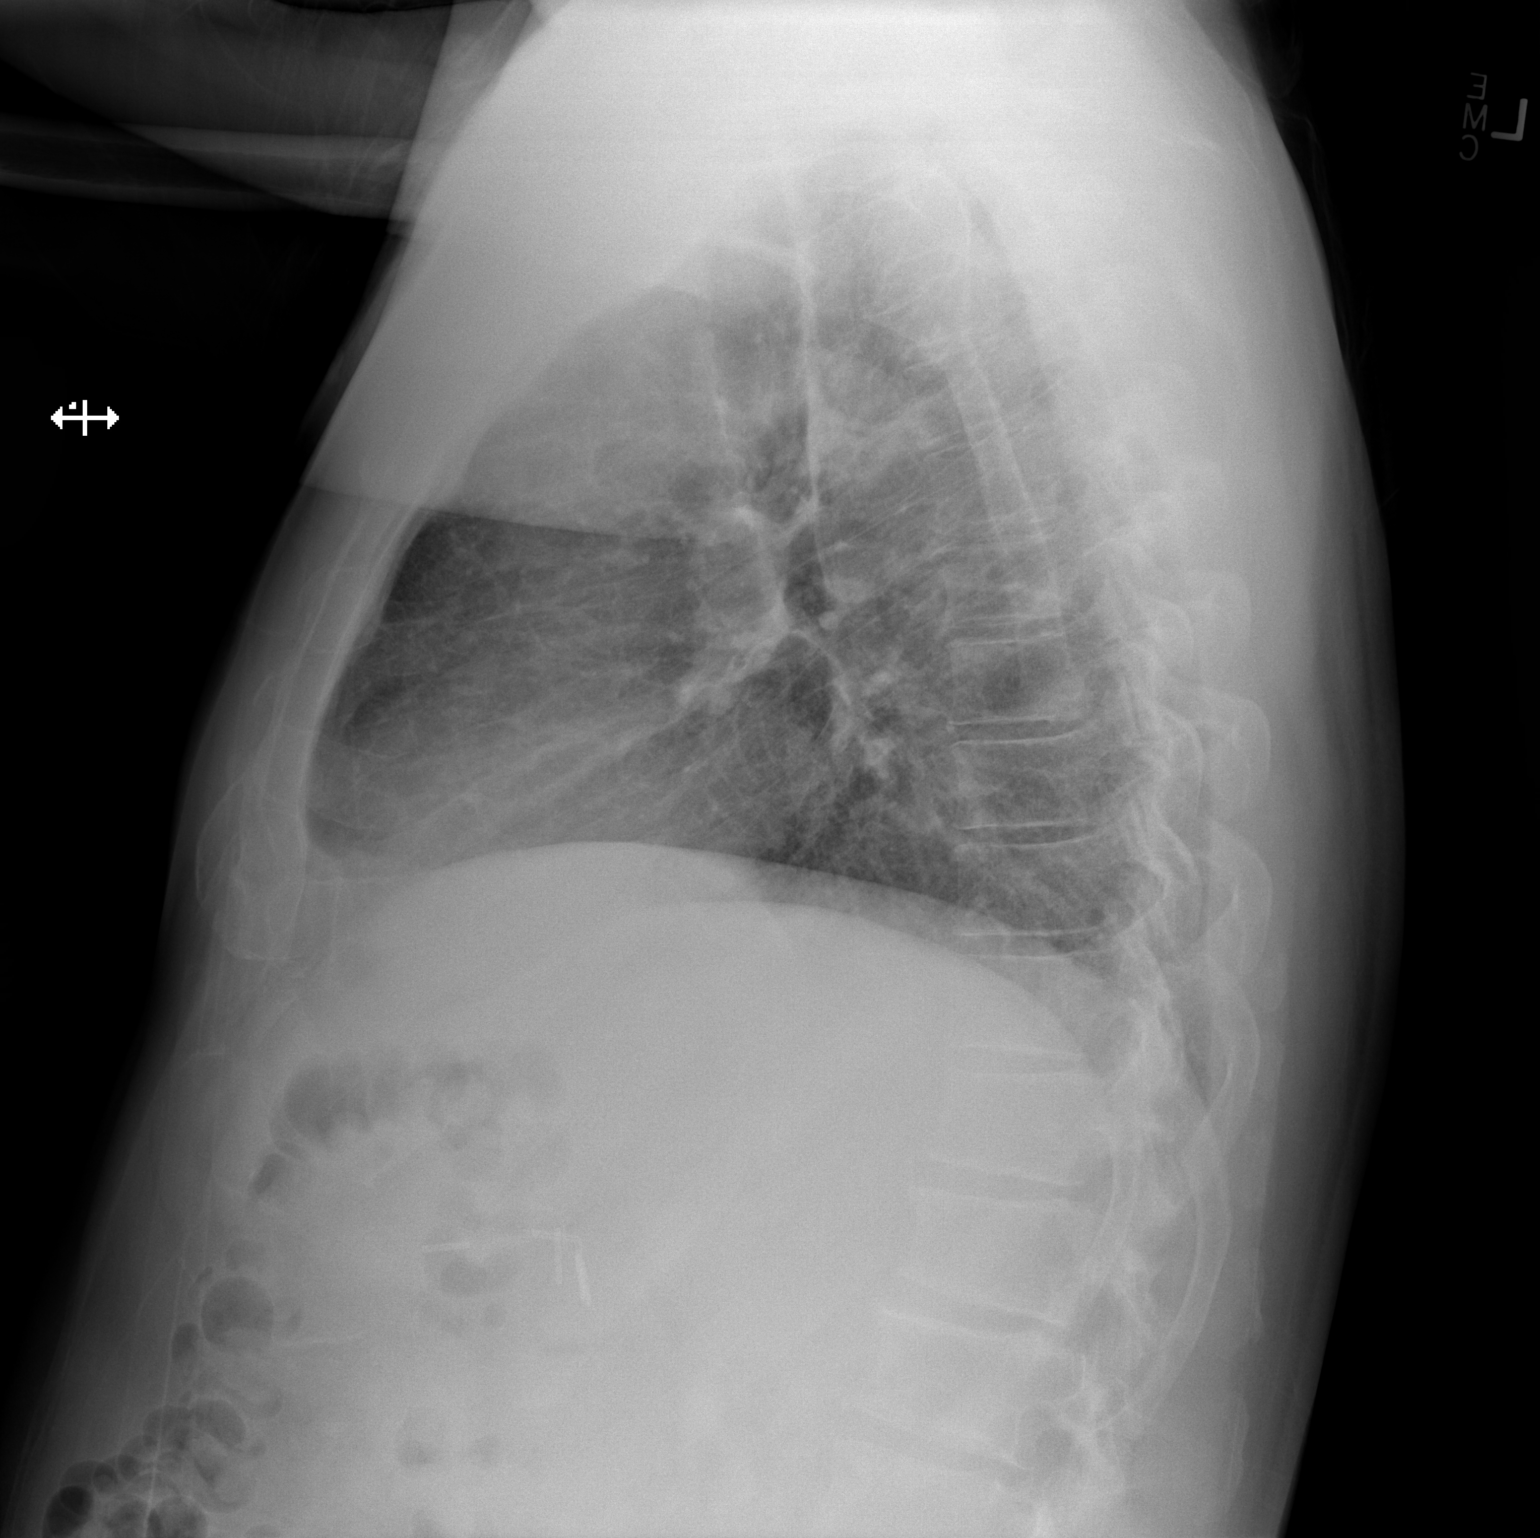

[2 of 2 positions shown; findings below may reference images not displayed]

FINDINGS: Normal heart size and mediastinal contours. No acute infiltrate or
edema. No effusion or pneumothorax. No acute osseous findings.

Cholecystectomy.
IMPRESSION: No active cardiopulmonary disease.

## 2017-03-29 ENCOUNTER — Emergency Department (HOSPITAL_COMMUNITY)
Admission: EM | Admit: 2017-03-29 | Discharge: 2017-03-29 | Disposition: A | Discharge status: Home / Self Care | Payer: Medicare Other | Attending: Emergency Medicine | Admitting: Emergency Medicine

## 2017-03-29 ENCOUNTER — Encounter (HOSPITAL_COMMUNITY): Payer: Self-pay | Admitting: *Deleted

## 2017-03-29 DIAGNOSIS — L988 Other specified disorders of the skin and subcutaneous tissue: Secondary | ICD-10-CM | POA: Diagnosis present

## 2017-03-29 DIAGNOSIS — L309 Dermatitis, unspecified: Secondary | ICD-10-CM

## 2017-03-29 DIAGNOSIS — L259 Unspecified contact dermatitis, unspecified cause: Secondary | ICD-10-CM | POA: Diagnosis not present

## 2017-03-29 MED ORDER — TRIAMCINOLONE ACETONIDE 0.1 % EX CREA: 1.0000 "application " | TOPICAL_CREAM | Freq: Two times a day (BID) | CUTANEOUS | 0 refills | Status: AC

## 2017-03-29 NOTE — Discharge Instructions (Signed)
Begin applying Triamcinolone cream twice daily to affected areas. Follow up with [facility] dermatology in 2-3 days if symptoms do not improve. Return to ED for worsening symptoms, bleeding, fevers, trouble breathing or injury.

## 2017-03-29 NOTE — ED Provider Notes (Signed)
[facility] Provider Note   CSN: [id number] Arrival date & time: 03/29/17  4098  By signing my name below, I, Richard Norris, attest that this documentation has been prepared under the direction and in the presence of Richard Vorhees, PA-C. Electronically Signed: Orpah Norris , ED Scribe. 03/29/17. 3:54 PM.   History   Chief Complaint Chief Complaint  Patient presents with  . Skin Ulcer    HPI Richard Norris is a 61 y.o. male with hx bipolar, schizophrenia who presents to the Emergency Department complaining of worsening skin ulcers with onset x6 months.Pt states that he has had multiple areas of irritation to the scalp, chest and shoulders for the past x6 months. He states that the areas improve after drainage but then begin to spread. He states "it feels like a nail is stuck into my head with electrical tentacles going into my scalp." Pt reports scalp itchiness/pain. He has taken Prednisone with no relief. He denies fever, abdominal pain, SOB. Pt denies any new environmental changes, any new animal contact. Of note, pt reports seeing a dermatologist x1 week ago for scalp wounds and was told that he needed to have a biopsy on the area. He was prescribed Prednisone and reportedly finished the last of the medication x1 day ago. He reports known allergy to Penicillin, Sulfa-drugs and Erythromycin. .    The history is provided by the patient. No language interpreter was used.    Past Medical History:  Diagnosis Date  . Anxiety   . Bipolar 1 disorder (HCC)   . Delusional disorder (HCC)   . Depression   . PTSD (post-traumatic stress disorder)   . Schizophrenia [facility])     Patient Active Problem List   Diagnosis Date Noted  . Paranoia (psychosis) (HCC) 07/12/2015  . Paranoia (HCC)   . Schizoaffective disorder, unspecified type [facility])     Past Surgical History:  Procedure Laterality Date  . CHOLECYSTECTOMY    . KNEE ARTHROPLASTY    . SINUSOTOMY         Home  Medications    Prior to Admission medications   Medication Sig Start Date End Date Taking? Authorizing Provider  Cholecalciferol (VITAMIN D-3 PO) Take 1 capsule by mouth daily.    Historical Provider, MD  Cyanocobalamin (VITAMIN B-12 PO) Take 1 tablet by mouth daily.    Historical Provider, MD  diazepam (VALIUM) 10 MG tablet Take 10 mg by mouth 3 (three) times daily as needed for anxiety. 08/06/16   Historical Provider, MD  diclofenac sodium (VOLTAREN) 1 % GEL Apply 2 g topically 4 (four) times daily. 09/23/16   Audry Pili, PA-C  ibuprofen (ADVIL,MOTRIN) 600 MG tablet Take 1 tablet (600 mg total) by mouth every 6 (six) hours as needed. 09/29/16   Barrett Henle, PA-C  Multiple Vitamin (MULTIVITAMIN WITH MINERALS) TABS tablet Take 1 tablet by mouth daily. Men's Multivitamin     Historical Provider, MD  naproxen sodium (ALEVE) 220 MG tablet Take 220-440 mg by mouth 2 (two) times daily as needed (for pain).    Historical Provider, MD  nicotine (NICODERM CQ - DOSED IN MG/24 HOURS) 21 mg/24hr patch Place 1 patch (21 mg total) onto the skin daily. Patient not taking: Reported on 09/16/2016 07/12/15   Earney Navy, NP  traMADol (ULTRAM) 50 MG tablet Take 50 mg by mouth every 6 (six) hours as needed (for pain).  06/22/15   Historical Provider, MD  triamcinolone cream (KENALOG) 0.1 % Apply 1 application topically 2 (two)  times daily. 03/29/17   Honora Searson, PA-C    Family History No family history on file.  Social History Social History  Substance Use Topics  . Smoking status: Never Smoker  . Smokeless tobacco: Never Used  . Alcohol use Yes     Comment: occassional     Allergies   Erythromycin; Penicillins; Sulfa antibiotics; and Sulfur   Review of Systems Review of Systems  Constitutional: Negative for fever.  Respiratory: Negative for shortness of breath.   Gastrointestinal: Negative for abdominal pain.  Skin:       Skin irritation to scalp, chest and bilateral shoulders      Physical Exam Updated Vital Signs BP 138/87 (BP Location: Left Arm)   Pulse 83   Temp 97.9 F (36.6 C) (Oral)   Resp 14   Ht  (1.778 m)   Wt 97.5 kg   SpO2 96%   BMI 30.85 kg/m   Physical Exam  Constitutional: He appears well-developed and well-nourished.  HENT:  Head: Normocephalic and atraumatic.  Eyes: Conjunctivae are normal.  Neck: Neck supple.  Cardiovascular: Normal rate and regular rhythm.   No murmur heard. Pulmonary/Chest: Effort normal and breath sounds normal. No respiratory distress.  Abdominal: Soft. There is no tenderness.  Musculoskeletal: He exhibits no edema.  Neurological: He is alert.  Skin: Skin is warm and dry.  Multiple areas of excoriation and redness on the scalp. A few areas on the back of his neck and on chest but no bleeding, no signs of cellulitis or infection.   Psychiatric: He has a normal mood and affect.  Nursing note and vitals reviewed.    ED Treatments / Results   DIAGNOSTIC STUDIES: Oxygen Saturation is 96% on RA, adequate by my interpretation.   COORDINATION OF CARE: 3:54 PM-Discussed next steps with pt. Pt verbalized understanding and is agreeable with the plan.    Labs (all labs ordered are listed, but only abnormal results are displayed) Labs Reviewed - No data to display  EKG  EKG Interpretation None       Radiology No results found.  Procedures Procedures (including critical care time)  Medications Ordered in ED Medications - No data to display   Initial Impression / Assessment and Plan / ED Course  I have reviewed the triage vital signs and the nursing notes.  Pertinent labs & imaging results that were available during my care of the patient were reviewed by me and considered in my medical decision making (see chart for details).     Patient's history and symptoms concerning for dermatitis but unsure of cause. Could be contact although he has a history of 49mo of symptoms. He declined  biopsy at dermatologist's office. I advised him that he will eventually need to get this done in order to find the cause and a suitable treatment. For now, we will treat with kenalog cream for symptomatic relief. Return precautions given.  Final Clinical Impressions(s) / ED Diagnoses   Final diagnoses:  Dermatitis    New Prescriptions Discharge Medication List as of 03/29/2017  9:56 AM    START taking these medications   Details  triamcinolone cream (KENALOG) 0.1 % Apply 1 application topically 2 (two) times daily., Starting Fri 03/29/2017, Print       I personally performed the services described in this documentation, which was scribed in my presence. The recorded information has been reviewed and is accurate.     Dietrich Pates, PA-C 03/29/17 1559    Tilden Fossa, MD  03/30/17 0722  

## 2017-03-29 NOTE — ED Triage Notes (Signed)
Pt reports seeing a dermatologist last week for scalp wounds, pt reports taking prednisone for symptoms, states, "It feels like something is drilling into my head. I am taking something for itching and I am worried it will get infected." pt reports compliance with bipolar & schizophrenia meds, A&O x4

## 2017-03-29 NOTE — ED Notes (Signed)
Papers reviewed with patient by Wyvonnia Dusky student, with myself present at bedside to make sure all questions were answered

## 2017-08-15 ENCOUNTER — Encounter (HOSPITAL_COMMUNITY): Payer: Self-pay | Admitting: Emergency Medicine

## 2017-08-15 ENCOUNTER — Ambulatory Visit (HOSPITAL_COMMUNITY)
Admission: EM | Admit: 2017-08-15 | Discharge: 2017-08-15 | Disposition: A | Discharge status: Home / Self Care | Payer: Medicare Other | Attending: Family Medicine | Admitting: Family Medicine

## 2017-08-15 DIAGNOSIS — L03119 Cellulitis of unspecified part of limb: Secondary | ICD-10-CM

## 2017-08-15 DIAGNOSIS — H6981 Other specified disorders of Eustachian tube, right ear: Secondary | ICD-10-CM

## 2017-08-15 MED ORDER — IPRATROPIUM BROMIDE 0.06 % NA SOLN: 2.0000 | Freq: Four times a day (QID) | NASAL | 0 refills | Status: AC

## 2017-08-15 MED ORDER — DOXYCYCLINE HYCLATE 100 MG PO CAPS: 100.0000 mg | ORAL_CAPSULE | Freq: Two times a day (BID) | ORAL | 0 refills | Status: AC

## 2017-08-15 NOTE — ED Notes (Signed)
Urine specimen obtained and in lab 

## 2017-08-15 NOTE — ED Provider Notes (Signed)
[facility]   [id number] 08/15/17 Arrival Time: 1234  ASSESSMENT & PLAN:  1. Cellulitis of wrist   2. ETD (Eustachian tube dysfunction), right     Meds ordered this encounter  Medications  . doxycycline (VIBRAMYCIN) 100 MG capsule    Sig: Take 1 capsule (100 mg total) by mouth 2 (two) times daily.    Dispense:  20 capsule    Refill:  0    Order Specific Question:   Supervising Provider    Answer:   Mardella Layman [id number]  . ipratropium (ATROVENT) 0.06 % nasal spray    Sig: Place 2 sprays into both nostrils 4 (four) times daily.    Dispense:  15 mL    Refill:  0    Order Specific Question:   Supervising Provider    Answer:   Mardella Layman [[id number]]    Reviewed expectations re: course of current medical issues. Questions answered. Outlined signs and symptoms indicating need for more acute intervention. Patient verbalized understanding. After Visit Summary given.   SUBJECTIVE:  Richard Norris is a 61 y.o. male who presents with complaint of left wrist pain and wound.   C/o right ear discomfort.  C/o sinus congestion.  ROS: As per HPI.   OBJECTIVE:  Vitals:   08/15/17 1351  BP: 138/86  Pulse: 70  Temp: 98.2 F (36.8 C)  TempSrc: Oral  SpO2: 99%    General appearance: alert; no distress Eyes: PERRLA; EOMI; conjunctiva normal HENT: normocephalic; atraumatic; TLungs: clear to auscultation bilaterally Heart: regular rate and rhythm Extremities: no cyanosis or edema; symmetrical with no gross deformities Skin: Left wrist with cyst and erythema approx 1 cm diameter and central injection no fluctuance. Neurologic: normal gait; normal symmetric reflexes Psychological: alert and cooperative; normal mood and affect  Labs:  Labs Reviewed - No data to display  Imaging: No results found.  Allergies  Allergen Reactions  . Erythromycin Nausea And Vomiting  . Penicillins Other (See Comments)    Childhood reaction Has patient had a PCN reaction  causing immediate rash, facial/tongue/throat swelling, SOB or lightheadedness with hypotension: Yes Has patient had a PCN reaction causing severe rash involving mucus membranes or skin necrosis: Unknown Has patient had a PCN reaction that required hospitalization: Unknown Has patient had a PCN reaction occurring within the last 10 years: No If all of the above answers are "NO", then may proceed with Cephalosporin use.   . Sulfa Antibiotics Diarrhea and Nausea And Vomiting  . Sulfur Nausea And Vomiting    Past Medical History:  Diagnosis Date  . Anxiety   . Bipolar 1 disorder (HCC)   . Delusional disorder (HCC)   . Depression   . PTSD (post-traumatic stress disorder)   . Schizophrenia [facility])    Social History   Social History  . Marital status: Single    Spouse name: N/A  . Number of children: N/A  . Years of education: N/A   Occupational History  . Not on file.   Social History Main Topics  . Smoking status: Never Smoker  . Smokeless tobacco: Never Used  . Alcohol use Yes     Comment: occassional  . Drug use: No  . Sexual activity: Not on file   Other Topics Concern  . Not on file   Social History Narrative  . No narrative on file   History reviewed. No pertinent family history. Past Surgical History:  Procedure Laterality Date  . CHOLECYSTECTOMY    . KNEE ARTHROPLASTY    .  SINUSOTOMY       Deatra Canter[place], [place] J, FNP 08/15/17 1419

## 2017-08-15 NOTE — ED Triage Notes (Signed)
Pt reports a bump on his left forearm of unknown ideology.

## 2019-11-17 DEATH — deceased
